# Patient Record
Sex: Female | Born: 1985 | Race: White | Hispanic: No | Marital: Married | Smoking: Never smoker
Health system: Southern US, Community
[De-identification: ages and names within clinical notes are randomized; demographics above are authoritative.]

---

## 2004-01-25 ENCOUNTER — Emergency Department: Payer: Self-pay | Admitting: Emergency Medicine

## 2004-03-05 ENCOUNTER — Emergency Department: Payer: Self-pay | Admitting: Emergency Medicine

## 2004-04-03 ENCOUNTER — Observation Stay: Payer: Self-pay | Admitting: Certified Nurse Midwife

## 2004-04-25 ENCOUNTER — Observation Stay: Payer: Self-pay

## 2004-05-23 ENCOUNTER — Observation Stay: Payer: Self-pay | Admitting: Obstetrics and Gynecology

## 2004-06-04 ENCOUNTER — Observation Stay: Payer: Self-pay | Admitting: Obstetrics and Gynecology

## 2004-06-22 ENCOUNTER — Observation Stay: Payer: Self-pay | Admitting: Obstetrics and Gynecology

## 2004-07-16 ENCOUNTER — Observation Stay: Payer: Self-pay

## 2004-07-19 ENCOUNTER — Inpatient Hospital Stay: Payer: Self-pay | Admitting: Obstetrics and Gynecology

## 2004-08-15 ENCOUNTER — Inpatient Hospital Stay: Payer: Self-pay | Admitting: Infectious Diseases

## 2004-08-17 ENCOUNTER — Other Ambulatory Visit: Payer: Self-pay

## 2004-09-18 ENCOUNTER — Ambulatory Visit: Payer: Self-pay | Admitting: Internal Medicine

## 2004-10-05 ENCOUNTER — Ambulatory Visit: Payer: Self-pay | Admitting: Internal Medicine

## 2005-04-19 ENCOUNTER — Other Ambulatory Visit: Payer: Self-pay

## 2005-04-19 ENCOUNTER — Emergency Department: Payer: Self-pay | Admitting: Emergency Medicine

## 2005-10-16 ENCOUNTER — Ambulatory Visit: Payer: Self-pay | Admitting: Obstetrics and Gynecology

## 2006-12-02 ENCOUNTER — Emergency Department (HOSPITAL_COMMUNITY): Admission: EM | Admit: 2006-12-02 | Discharge: 2006-12-02 | Payer: Self-pay | Admitting: Emergency Medicine

## 2007-01-11 IMAGING — CT CT CHEST W/ CM
1 series · 16 of 31 positions shown, 20 images · IV contrast (APPLIED)
Comparison: none

REASON FOR EXAM: rule out pulmonary embolus
COMMENTS:

[Series 7: soft tissue · axial · 0.66mm/px · z∈[-218,-54]mm · 16 of 61 slices shown, 20 images]
[im 3/61  mediastinal]
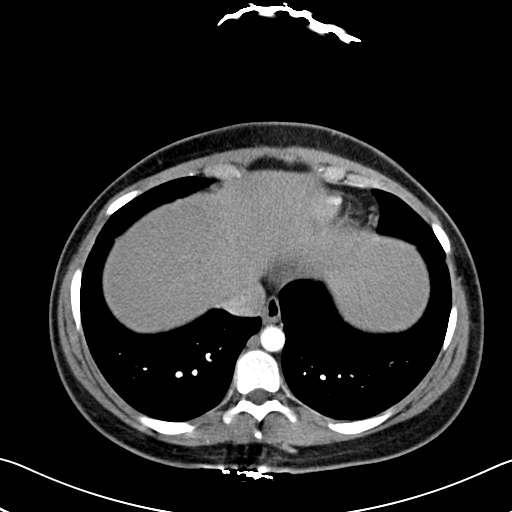
[im 3/61  lung]
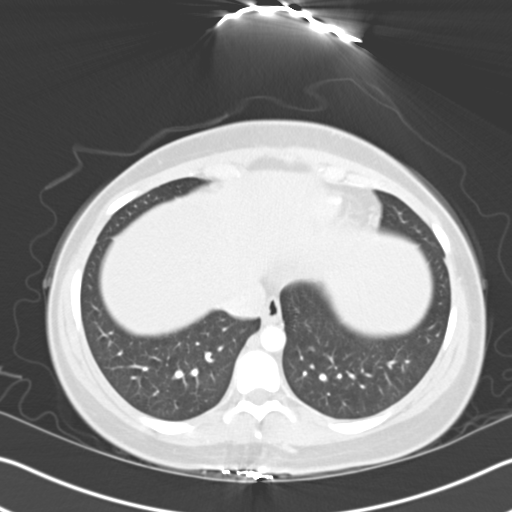
[im 7/61  lung]
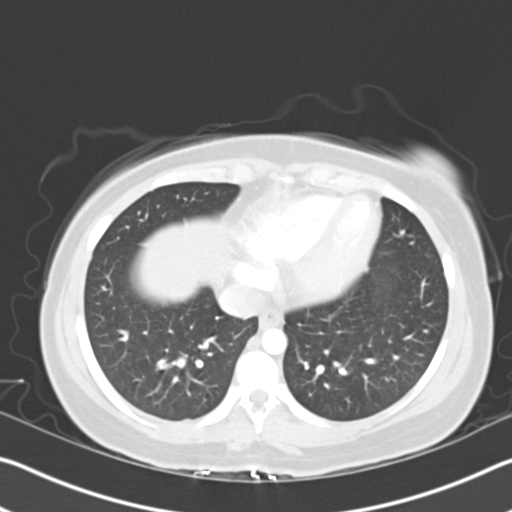
[im 12/61  lung]
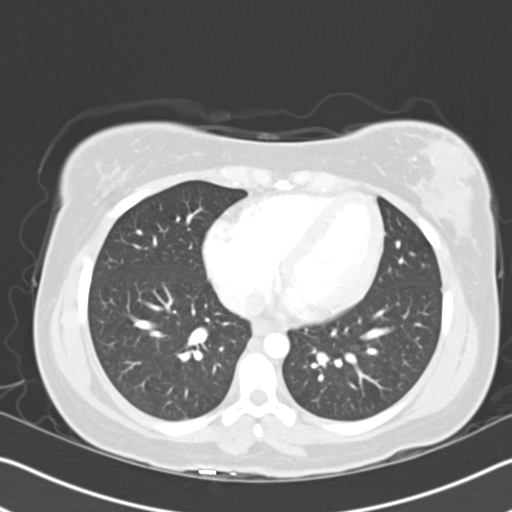
[im 14/61  lung]
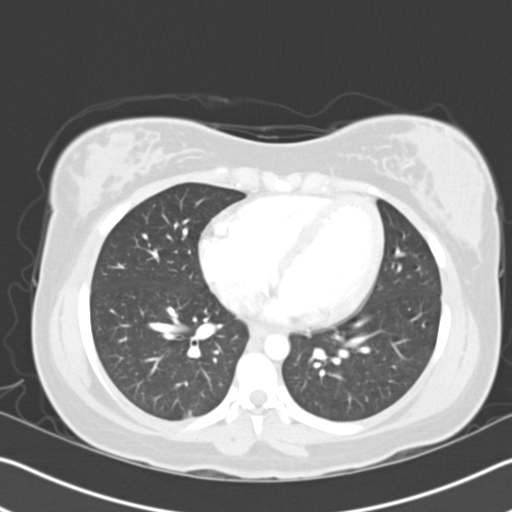
[im 18/61  mediastinal]
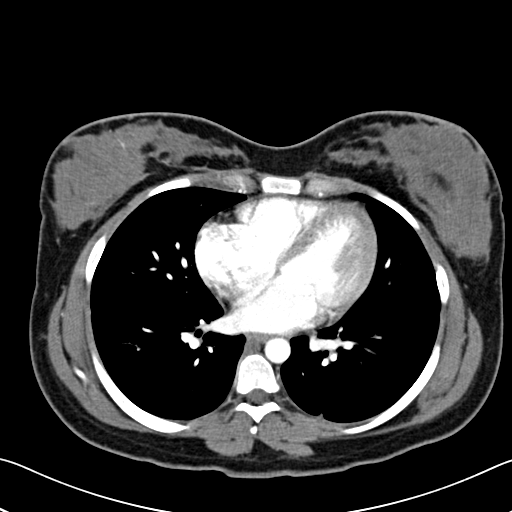
[im 18/61  lung]
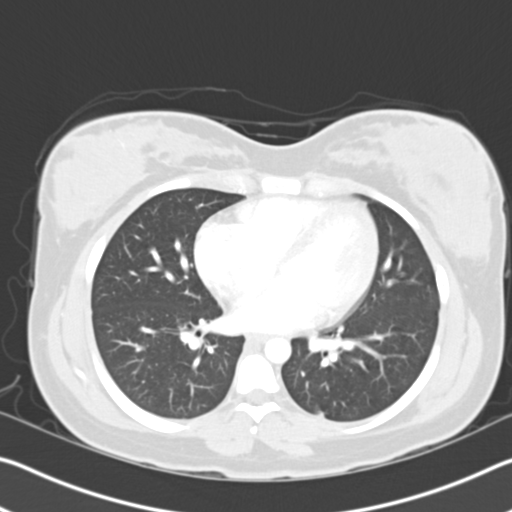
[im 21/61  lung]
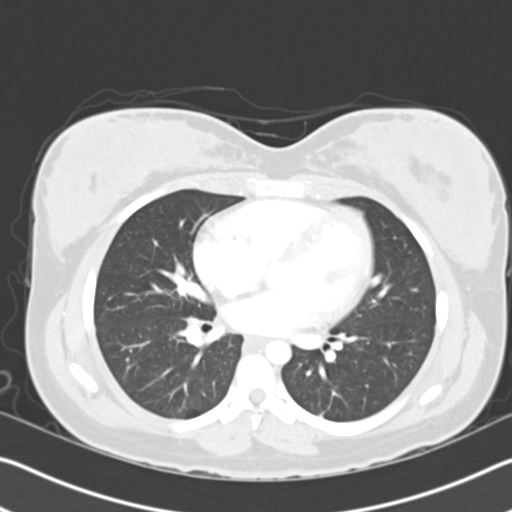
[im 25/61  lung]
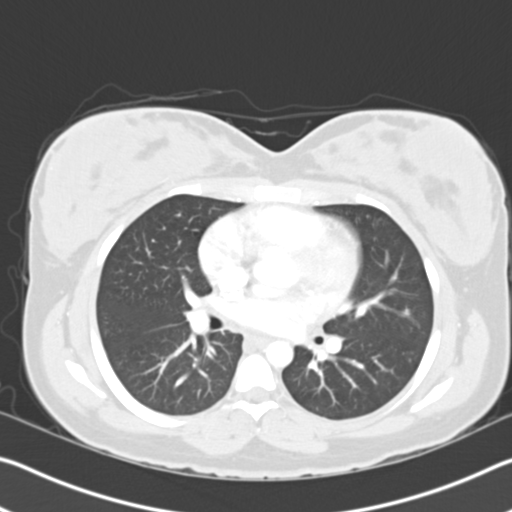
[im 28/61  lung]
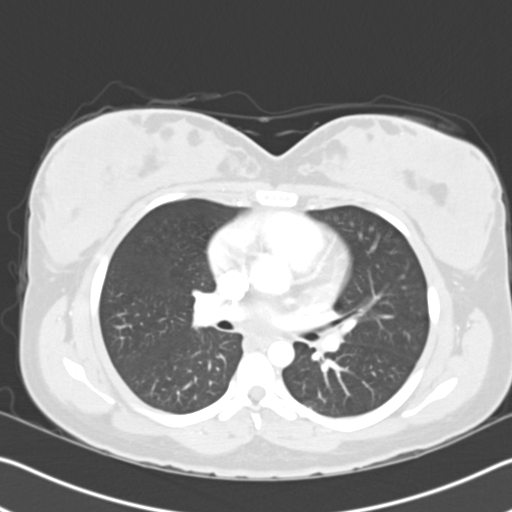
[im 32/61  mediastinal]
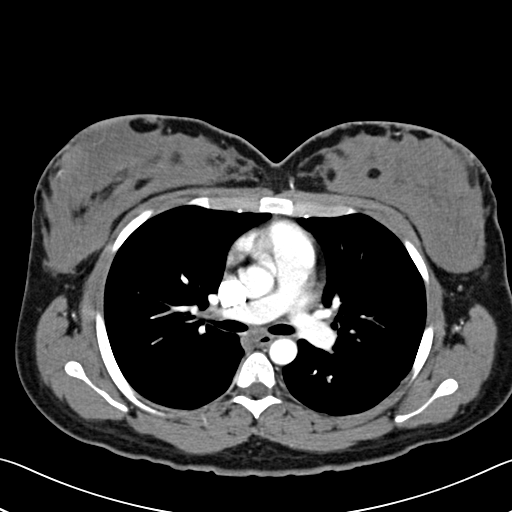
[im 32/61  lung]
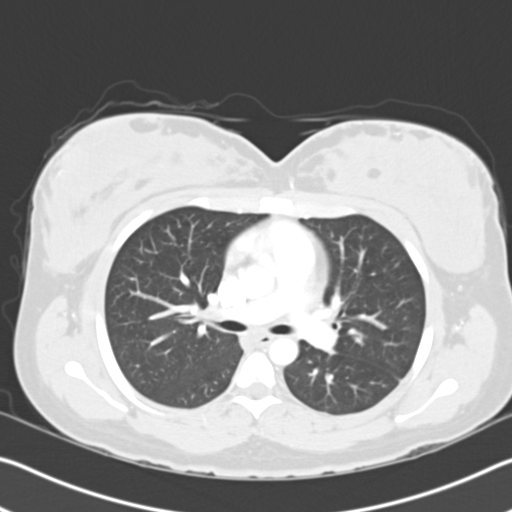
[im 36/61  lung]
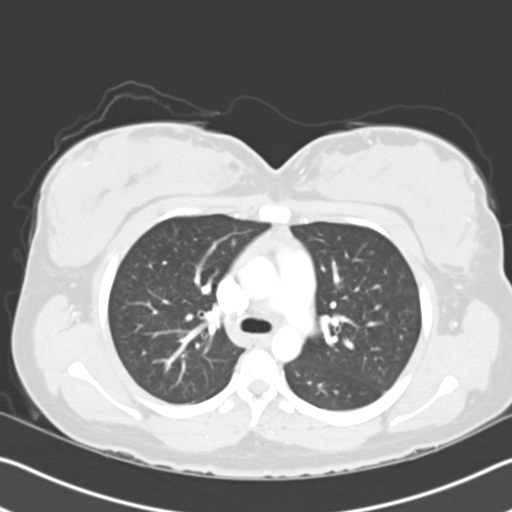
[im 38/61  lung]
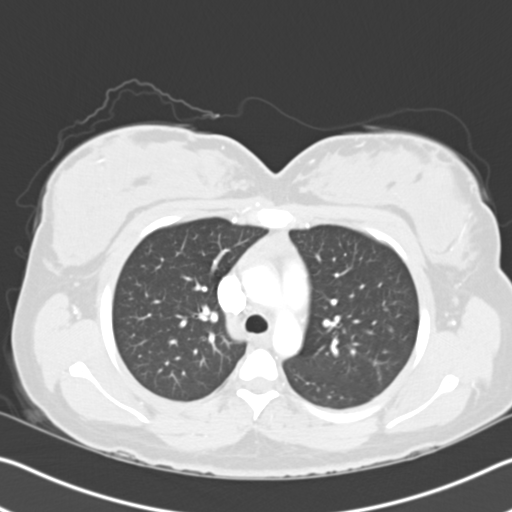
[im 41/61  lung]
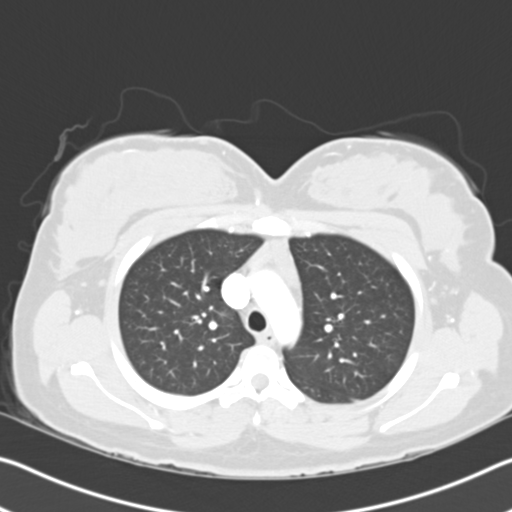
[im 45/61  mediastinal]
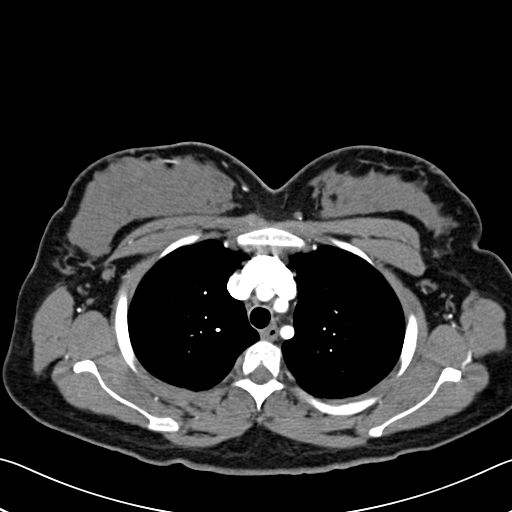
[im 45/61  lung]
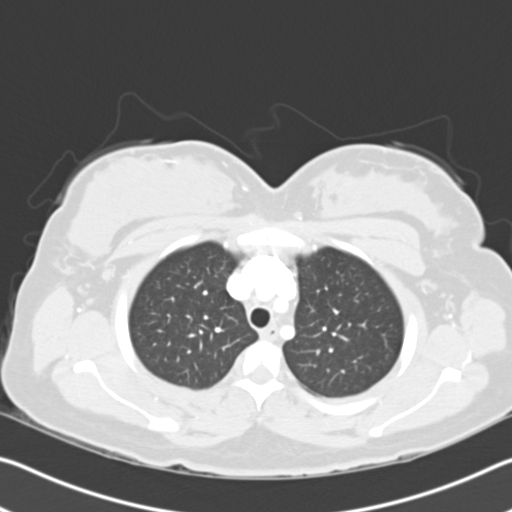
[im 49/61  lung]
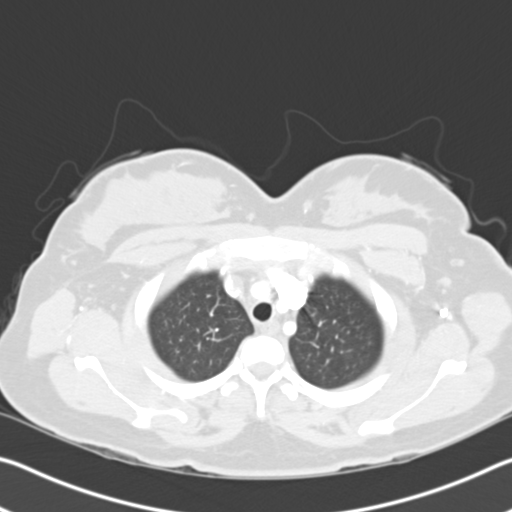
[im 54/61  lung]
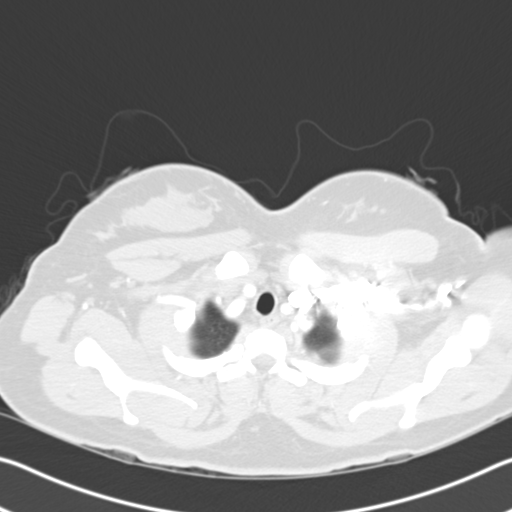
[im 58/61  lung]
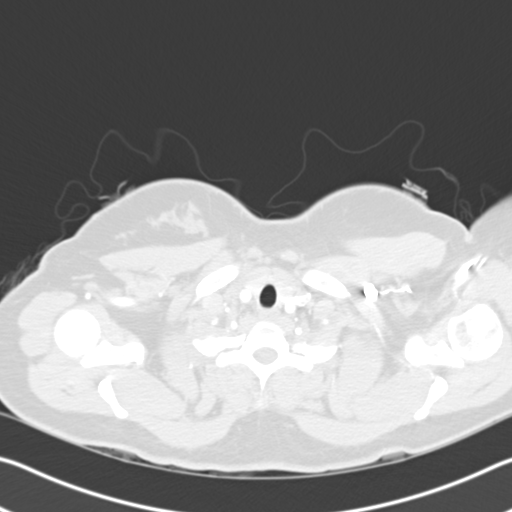

[16 of 31 positions shown; findings below may reference images not displayed]

PROCEDURE:     CT  - CT CHEST (FOR PE) W  - July 20, 2004  [DATE]

RESULT:       IV contrast enhanced emergent CT scan of the chest is
performed.  The patient was shielded.  There is good opacification of the
pulmonary arteries with the 75 cc of iodinated contrast.  There is no
filling defect to suggest a diagnosis of pulmonary embolism.  There is no
pleural effusion. The lungs are clear except for some minimal areas of
atelectasis in the lower lobe regions inferiorly.  No pneumothorax is seen.
The heart is not enlarged.  There is no pericardial effusion.
IMPRESSION: 1.     Minimal basilar atelectasis.
2.     No evidence of pulmonary embolism.

## 2010-11-14 LAB — COMPREHENSIVE METABOLIC PANEL
ALT: 24
AST: 24
Albumin: 4.3
Alkaline Phosphatase: 71
Chloride: 105
GFR calc Af Amer: >60
Potassium: 3.8
Sodium: 137
Total Bilirubin: 1
Total Protein: 7.3

## 2010-11-14 LAB — DIFFERENTIAL
Basophils Absolute: 0
Basophils Relative: 0
Eosinophils Relative: 2
Monocytes Absolute: 0.4
Monocytes Relative: 5

## 2010-11-14 LAB — URINALYSIS, ROUTINE W REFLEX MICROSCOPIC
Glucose, UA: NEGATIVE
Hgb urine dipstick: NEGATIVE
Ketones, ur: NEGATIVE
Protein, ur: NEGATIVE
pH: 7

## 2010-11-14 LAB — CBC
HCT: 37.8
Platelets: 283
RDW: 12.7
WBC: 7.5

## 2013-09-14 ENCOUNTER — Encounter (HOSPITAL_COMMUNITY): Payer: Self-pay | Admitting: Emergency Medicine

## 2013-09-14 ENCOUNTER — Emergency Department (HOSPITAL_COMMUNITY)
Admission: EM | Admit: 2013-09-14 | Discharge: 2013-09-14 | Disposition: A | Discharge status: Home / Self Care | Payer: Self-pay | Attending: Emergency Medicine | Admitting: Emergency Medicine

## 2013-09-14 DIAGNOSIS — K089 Disorder of teeth and supporting structures, unspecified: Secondary | ICD-10-CM | POA: Insufficient documentation

## 2013-09-14 DIAGNOSIS — H9209 Otalgia, unspecified ear: Secondary | ICD-10-CM | POA: Insufficient documentation

## 2013-09-14 DIAGNOSIS — H9201 Otalgia, right ear: Secondary | ICD-10-CM

## 2013-09-14 DIAGNOSIS — K0889 Other specified disorders of teeth and supporting structures: Secondary | ICD-10-CM

## 2013-09-14 DIAGNOSIS — Z792 Long term (current) use of antibiotics: Secondary | ICD-10-CM | POA: Insufficient documentation

## 2013-09-14 DIAGNOSIS — K029 Dental caries, unspecified: Secondary | ICD-10-CM | POA: Insufficient documentation

## 2013-09-14 MED ORDER — AMOXICILLIN 500 MG PO CAPS: 500.0000 mg | ORAL_CAPSULE | Freq: Three times a day (TID) | ORAL | Status: AC

## 2013-09-14 MED ORDER — AMOXICILLIN 250 MG PO CAPS
500.0000 mg | ORAL_CAPSULE | Freq: Once | ORAL | Status: AC
  Administered 2013-09-14: 500 mg via ORAL
  Filled 2013-09-14: qty 2

## 2013-09-14 MED ORDER — HYDROCODONE-ACETAMINOPHEN 5-325 MG PO TABS: ORAL_TABLET | ORAL | Status: AC

## 2013-09-14 MED ORDER — ANTIPYRINE-BENZOCAINE 5.4-1.4 % OT SOLN
3.0000 [drp] | Freq: Once | OTIC | Status: AC
  Administered 2013-09-14: 3 [drp] via OTIC
  Filled 2013-09-14: qty 10

## 2013-09-14 MED ORDER — HYDROCODONE-ACETAMINOPHEN 5-325 MG PO TABS
1.0000 | ORAL_TABLET | Freq: Once | ORAL | Status: AC
  Administered 2013-09-14: 1 via ORAL
  Filled 2013-09-14: qty 1

## 2013-09-14 NOTE — ED Notes (Signed)
Patient states that she has been having right ear pain for 1 1/2 weeks. Patient states that it is harder for her to hear out of her right ear,but denies noticing any drainage

## 2013-09-14 NOTE — ED Notes (Signed)
Pt reports taking 6 Ibuprofen today.

## 2013-09-14 NOTE — Discharge Instructions (Signed)
Dental Pain Toothache is pain in or around a tooth. It may get worse with chewing or with cold or heat.  HOME CARE  Your dentist may use a numbing medicine during treatment. If so, you may need to avoid eating until the medicine wears off. Ask your dentist about this.  Only take medicine as told by your dentist or doctor.  Avoid chewing food near the painful tooth until after all treatment is done. Ask your dentist about this. GET HELP RIGHT AWAY IF:   The problem gets worse or new problems appear.  You have a fever.  There is redness and puffiness (swelling) of the face, jaw, or neck.  You cannot open your mouth.  There is pain in the jaw.  There is very bad pain that is not helped by medicine. MAKE SURE YOU:   Understand these instructions.  Will watch your condition.  Will get help right away if you are not doing well or get worse. Document Released: 07/10/2007 Document Revised: 04/15/2011 Document Reviewed: 07/10/2007 [facility] Patient Information 2015 [employer], [place]. This information is not intended to replace advice given to you by your health care provider. Make sure you discuss any questions you have with your health care provider.  Otalgia Otalgia is pain in or around the ear. When the pain is from the ear itself it is called primary otalgia. Pain may also be coming from somewhere else, like the head and neck. This is called secondary otalgia.  CAUSES  Causes of primary otalgia include:  Middle ear infection.  It can also be caused by injury to the ear or infection of the ear canal (swimmer's ear). Swimmer's ear causes pain, swelling and often drainage from the ear canal. Causes of secondary otalgia include:  Sinus infections.  Allergies and colds that cause stuffiness of the nose and tubes that drain the ears (eustachian tubes).  Dental problems like cavities, gum infections or teething.  Sore Throat (tonsillitis and pharyngitis).  Swollen glands in the  neck.  Infection of the bone behind the ear (mastoiditis).  TMJ discomfort (problems with the joint between your jaw and your skull).  Other problems such as nerve disorders, circulation problems, heart disease and tumors of the head and neck can also cause symptoms of ear pain. This is rare. DIAGNOSIS  Evaluation, Diagnosis and Testing:  Examination by your medical caregiver is recommended to evaluate and diagnose the cause of otalgia.  Further testing or referral to a specialist may be indicated if the cause of the ear pain is not found and the symptom persists. TREATMENT   Your doctor may prescribe antibiotics if an ear infection is diagnosed.  Pain relievers and topical analgesics may be recommended.  It is important to take all medications as prescribed. HOME CARE INSTRUCTIONS   It may be helpful to sleep with the painful ear in the up position.  A warm compress over the painful ear may provide relief.  A soft diet and avoiding gum may help while ear pain is present. SEEK IMMEDIATE MEDICAL CARE IF:  You develop severe pain, a high fever, repeated vomiting or dehydration.  You develop extreme dizziness, headache, confusion, ringing in the ears (tinnitus) or hearing loss. Document Released: 02/29/2004 Document Revised: 04/15/2011 Document Reviewed: 11/30/2008 [facility] CenterExitCare Patient Information 2015 [place], [place]. This information is not intended to replace advice given to you by your health care provider. Make sure you discuss any questions you have with your health care provider.

## 2013-09-14 NOTE — ED Notes (Signed)
Pt c/o right earache x 1.5 weeks.

## 2013-09-16 NOTE — ED Provider Notes (Signed)
CSN: [id number]     Arrival date & time 09/14/13  2051 History   First MD Initiated Contact with Patient 09/14/13 2109     Chief Complaint  Patient presents with  . Otalgia     (Consider location/radiation/quality/duration/timing/severity/associated sxs/prior Treatment)  Kendra Wilkins is a 28 y.o. female who presents to the Emergency Department complaining of right ear pain for 2 weeks. Patient is a 28 y.o. female presenting with ear pain. The history is provided by the patient.  Otalgia Location:  Right Behind ear:  No abnormality Quality:  Throbbing and pressure Severity:  Moderate Onset quality:  Gradual Duration:  2 weeks Timing:  Constant Progression:  Unchanged Chronicity:  New Context: not direct blow and not loud noise   Relieved by:  Nothing Worsened by:  Nothing tried Ineffective treatments:  OTC medications Associated symptoms: no abdominal pain, no congestion, no cough, no diarrhea, no ear discharge, no fever, no headaches, no hearing loss, no neck pain, no rash, no rhinorrhea, no sore throat, no tinnitus and no vomiting     History reviewed. No pertinent past medical history. History reviewed. No pertinent past surgical history. No family history on file. History  Substance Use Topics  . Smoking status: Never Smoker   . Smokeless tobacco: Not on file  . Alcohol Use: No   OB History   Grav Para Term Preterm Abortions TAB SAB Ect Mult Living                 Review of Systems  Constitutional: Negative for fever, chills, activity change and appetite change.  HENT: Positive for ear pain. Negative for congestion, ear discharge, facial swelling, hearing loss, rhinorrhea, sore throat, tinnitus and trouble swallowing.   Eyes: Negative for visual disturbance.  Respiratory: Negative for cough and shortness of breath.   Gastrointestinal: Negative for nausea, vomiting, abdominal pain and diarrhea.  Musculoskeletal: Negative for neck pain and neck stiffness.   Skin: Negative.  Negative for rash.  Neurological: Negative for dizziness, weakness, numbness and headaches.  Hematological: Negative for adenopathy.  Psychiatric/Behavioral: Negative for confusion.  All other systems reviewed and are negative.     Allergies  Review of patient's allergies indicates no known allergies.  Home Medications   Prior to Admission medications   Medication Sig Start Date End Date Taking? Authorizing Provider  ibuprofen (ADVIL,MOTRIN) 200 MG tablet Take 400 mg by mouth 3 (three) times daily as needed for mild pain or moderate pain.   Yes Historical Provider, MD  Multiple Vitamin (MULTIVITAMIN WITH MINERALS) TABS tablet Take 2 tablets by mouth every morning.   Yes Historical Provider, MD  amoxicillin (AMOXIL) 500 MG capsule Take 1 capsule (500 mg total) by mouth 3 (three) times daily. For 10 days 09/14/13   Tesean Stump L. Watson Robarge, PA-C  HYDROcodone-acetaminophen (NORCO/VICODIN) 5-325 MG per tablet Take one-two tabs po q 4-6 hrs prn pain 09/14/13   Leonna Schlee L. Danaisha Celli, PA-C   BP 126/81  Pulse 86  Temp(Src) 98.3 F (36.8 C) (Oral)  Resp 18  Ht 4\' 11"  (1.499 m)  Wt 145 lb (65.772 kg)  BMI 29.27 kg/m2  SpO2 100%  LMP 09/07/2013 Physical Exam  Nursing note and vitals reviewed. Constitutional: She is oriented to person, place, and time. She appears well-developed and well-nourished. No distress.  HENT:  Head: Normocephalic and atraumatic.  Right Ear: Ear canal normal. No mastoid tenderness. Tympanic membrane is not perforated. No hemotympanum.  Left Ear: Tympanic membrane and ear canal normal.  Mouth/Throat: Uvula is  midline, oropharynx is clear and moist and mucous membranes are normal. No oral lesions. No trismus in the jaw. Dental caries present. No uvula swelling. No oropharyngeal exudate.  Mild erythema of the right TM.   No drainage or edema of the ear canal, TM appears intact. Patient also has tenderness to palpation along the right lower molars. Dental caries  present. No obvious dental abscess or erythema of the surrounding gums   Neck: Normal range of motion. Neck supple.  Cardiovascular: Normal rate, regular rhythm, normal heart sounds and intact distal pulses.   No murmur heard. Pulmonary/Chest: Effort normal and breath sounds normal. No stridor. No respiratory distress.  Musculoskeletal: Normal range of motion.  Lymphadenopathy:    She has no cervical adenopathy.  Neurological: She is alert and oriented to person, place, and time. Coordination normal.  Skin: Skin is warm and dry. No rash noted.    ED Course  Procedures (including critical care time) Labs Review Labs Reviewed - No data to display  Imaging Review No results found.   EKG Interpretation None      MDM   Final diagnoses:  Otalgia of right ear  Pain, dental    Patient is well appearing. Vital signs are stable. No concerning symptoms for infection to the floor of the mouth or deep structures of the neck. Patient agrees to symptomatic treatment with Vicodin and amoxicillin. Otalgia is likely related to possible dental pain. Patient given referral information for local dentists. She appears stable for discharge.   Maylee Bare L. Tayloranne Lekas, PA-C 09/17/13 0000

## 2013-09-18 NOTE — ED Provider Notes (Signed)
Medical screening examination/treatment/procedure(s) were performed by non-physician practitioner and as supervising physician I was immediately available for consultation/collaboration.   EKG Interpretation None       Ahlana Slaydon L Yousef Huge, MD 09/18/13 0702
